# Patient Record
Sex: Male | Born: 1970 | Race: Black or African American | Hispanic: No | Marital: Married | Smoking: Never smoker
Health system: Southern US, Community
[De-identification: ages and names within clinical notes are randomized; demographics above are authoritative.]

## PROBLEM LIST (undated history)

## (undated) DIAGNOSIS — I10 Essential (primary) hypertension: Secondary | ICD-10-CM

## (undated) DIAGNOSIS — N200 Calculus of kidney: Secondary | ICD-10-CM

## (undated) DIAGNOSIS — E7800 Pure hypercholesterolemia, unspecified: Secondary | ICD-10-CM

---

## 2005-03-27 ENCOUNTER — Ambulatory Visit: Payer: Self-pay

## 2005-03-29 ENCOUNTER — Encounter: Admission: RE | Admit: 2005-03-29 | Discharge: 2005-03-29 | Payer: Self-pay | Admitting: Internal Medicine

## 2012-07-12 ENCOUNTER — Encounter (HOSPITAL_COMMUNITY): Payer: Self-pay | Admitting: Emergency Medicine

## 2012-07-12 ENCOUNTER — Emergency Department (HOSPITAL_COMMUNITY)
Admission: EM | Admit: 2012-07-12 | Discharge: 2012-07-13 | Disposition: A | Discharge status: Home / Self Care | Payer: BC Managed Care – PPO | Attending: Emergency Medicine | Admitting: Emergency Medicine

## 2012-07-12 DIAGNOSIS — IMO0002 Reserved for concepts with insufficient information to code with codable children: Secondary | ICD-10-CM | POA: Insufficient documentation

## 2012-07-12 DIAGNOSIS — I10 Essential (primary) hypertension: Secondary | ICD-10-CM | POA: Insufficient documentation

## 2012-07-12 DIAGNOSIS — R229 Localized swelling, mass and lump, unspecified: Secondary | ICD-10-CM | POA: Insufficient documentation

## 2012-07-12 DIAGNOSIS — T7840XA Allergy, unspecified, initial encounter: Secondary | ICD-10-CM

## 2012-07-12 DIAGNOSIS — Z79899 Other long term (current) drug therapy: Secondary | ICD-10-CM | POA: Insufficient documentation

## 2012-07-12 DIAGNOSIS — E7800 Pure hypercholesterolemia, unspecified: Secondary | ICD-10-CM | POA: Insufficient documentation

## 2012-07-12 HISTORY — DX: Essential (primary) hypertension: I10

## 2012-07-12 HISTORY — DX: Pure hypercholesterolemia, unspecified: E78.00

## 2012-07-12 MED ORDER — PREDNISONE 20 MG PO TABS: ORAL_TABLET | ORAL | Status: AC

## 2012-07-12 MED ORDER — SODIUM CHLORIDE 0.9 % IV BOLUS (SEPSIS)
1000.0000 mL | Freq: Once | INTRAVENOUS | Status: AC
  Administered 2012-07-12: 1000 mL via INTRAVENOUS

## 2012-07-12 MED ORDER — FAMOTIDINE IN NACL 20-0.9 MG/50ML-% IV SOLN
20.0000 mg | Freq: Once | INTRAVENOUS | Status: AC
  Administered 2012-07-12: 20 mg via INTRAVENOUS
  Filled 2012-07-12: qty 50

## 2012-07-12 MED ORDER — DIPHENHYDRAMINE HCL 50 MG/ML IJ SOLN
25.0000 mg | Freq: Once | INTRAMUSCULAR | Status: AC
  Administered 2012-07-12: 25 mg via INTRAVENOUS
  Filled 2012-07-12: qty 1

## 2012-07-12 MED ORDER — METHYLPREDNISOLONE SODIUM SUCC 125 MG IJ SOLR
125.0000 mg | Freq: Once | INTRAMUSCULAR | Status: AC
  Administered 2012-07-12: 125 mg via INTRAVENOUS
  Filled 2012-07-12: qty 2

## 2012-07-12 NOTE — ED Notes (Signed)
Pt alert, arrives from home, c/o swelling to upper lip, onset was today, resp even unlabored, skin pwd, no stridor noted, tolerating oral secretions well

## 2012-07-16 NOTE — ED Provider Notes (Signed)
History     CSN: [id number]  Arrival date & time 07/12/12  2126   First MD Initiated Contact with Patient 07/12/12 2133      Chief Complaint  Patient presents with  . Allergic Reaction    (Consider location/radiation/quality/duration/timing/severity/associated sxs/prior treatment) HPI.... swelling of upper lip for 2 hours prior to visit.  Patient takes Lotrel which is a combination of amlodipine and benazepril.  No airway obstruction.  Able to swallow.  Nothing makes symptoms better or worse. Severity is mild to moderate.  Past Medical History  Diagnosis Date  . Hypertension   . High cholesterol     History reviewed. No pertinent past surgical history.  No family history on file.  History  Substance Use Topics  . Smoking status: Never Smoker   . Smokeless tobacco: Not on file  . Alcohol Use: No      Review of Systems  All other systems reviewed and are negative.    Allergies  Ibuprofen  Home Medications   Current Outpatient Rx  Name  Route  Sig  Dispense  Refill  . amLODipine-benazepril (LOTREL) 10-40 MG per capsule   Oral   Take 1 capsule by mouth daily.         Marland Kitchen atorvastatin (LIPITOR) 80 MG tablet   Oral   Take 80 mg by mouth at bedtime.         . cetirizine (ZYRTEC) 10 MG tablet   Oral   Take 10 mg by mouth at bedtime.         . fluticasone (FLONASE) 50 MCG/ACT nasal spray   Nasal   Place 2 sprays into the nose daily.         Marland Kitchen OVER THE COUNTER MEDICATION   Oral   Take 1 capsule by mouth once. Benadryl allergy liquid gel         . predniSONE (DELTASONE) 20 MG tablet      3 tabs po day one, then 2 tabs daily x 4 days   11 tablet   0     BP 131/90  Pulse 78  Resp 18  Wt 165 lb (74.844 kg)  SpO2 100%  Physical Exam  Nursing note and vitals reviewed. Constitutional: He is oriented to person, place, and time. He appears well-developed and well-nourished.  HENT:  Head: Normocephalic and atraumatic.  Upper lip puffy and  edematous  Eyes: Conjunctivae and EOM are normal. Pupils are equal, round, and reactive to light.  Neck: Normal range of motion. Neck supple.  Cardiovascular: Normal rate, regular rhythm and normal heart sounds.   Pulmonary/Chest: Effort normal and breath sounds normal.  Abdominal: Soft. Bowel sounds are normal.  Musculoskeletal: Normal range of motion.  Neurological: He is alert and oriented to person, place, and time.  Skin: Skin is warm and dry.  Psychiatric: He has a normal mood and affect.    ED Course  Procedures (including critical care time)  Labs Reviewed - No data to display No results found.   1. Allergic reaction       MDM  IV Solu-Medrol, Benadryl, Pepcid given.   Discussed possibility of benazepril and induced edema.  Amlodipine could also be the offender.  Will discuss new antihypertensive treatment with his primary care Dr.    Patient feeling better at discharge        Donnetta Hutching, MD 07/16/12 [phone]

## 2014-05-13 ENCOUNTER — Emergency Department (HOSPITAL_COMMUNITY): Payer: BC Managed Care – PPO

## 2014-05-13 ENCOUNTER — Encounter (HOSPITAL_COMMUNITY): Payer: Self-pay | Admitting: *Deleted

## 2014-05-13 ENCOUNTER — Emergency Department (HOSPITAL_COMMUNITY)
Admission: EM | Admit: 2014-05-13 | Discharge: 2014-05-13 | Disposition: A | Discharge status: Home / Self Care | Payer: BC Managed Care – PPO | Attending: Emergency Medicine | Admitting: Emergency Medicine

## 2014-05-13 DIAGNOSIS — I10 Essential (primary) hypertension: Secondary | ICD-10-CM | POA: Diagnosis not present

## 2014-05-13 DIAGNOSIS — N200 Calculus of kidney: Secondary | ICD-10-CM | POA: Insufficient documentation

## 2014-05-13 DIAGNOSIS — R1033 Periumbilical pain: Secondary | ICD-10-CM | POA: Diagnosis present

## 2014-05-13 DIAGNOSIS — Z7951 Long term (current) use of inhaled steroids: Secondary | ICD-10-CM | POA: Diagnosis not present

## 2014-05-13 DIAGNOSIS — Z79899 Other long term (current) drug therapy: Secondary | ICD-10-CM | POA: Insufficient documentation

## 2014-05-13 DIAGNOSIS — R109 Unspecified abdominal pain: Secondary | ICD-10-CM

## 2014-05-13 DIAGNOSIS — E780 Pure hypercholesterolemia: Secondary | ICD-10-CM | POA: Insufficient documentation

## 2014-05-13 LAB — CBC WITH DIFFERENTIAL/PLATELET
Basophils Absolute: 0 10*3/uL (ref 0.0–0.1)
Basophils Relative: 0 % (ref 0–1)
Eosinophils Absolute: 0 10*3/uL (ref 0.0–0.7)
Eosinophils Relative: 0 % (ref 0–5)
HCT: 43.2 % (ref 39.0–52.0)
Hemoglobin: 15 g/dL (ref 13.0–17.0)
Lymphocytes Relative: 5 % — ABNORMAL LOW (ref 12–46)
Lymphs Abs: 0.7 10*3/uL (ref 0.7–4.0)
MCH: 31.3 pg (ref 26.0–34.0)
MCHC: 34.7 g/dL (ref 30.0–36.0)
MCV: 90 fL (ref 78.0–100.0)
Monocytes Absolute: 1 10*3/uL (ref 0.1–1.0)
Monocytes Relative: 8 % (ref 3–12)
Neutro Abs: 11.5 10*3/uL — ABNORMAL HIGH (ref 1.7–7.7)
Neutrophils Relative %: 87 % — ABNORMAL HIGH (ref 43–77)
Platelets: 200 10*3/uL (ref 150–400)
RBC: 4.8 MIL/uL (ref 4.22–5.81)
RDW: 12.5 % (ref 11.5–15.5)
WBC: 13.2 10*3/uL — ABNORMAL HIGH (ref 4.0–10.5)

## 2014-05-13 LAB — URINALYSIS, ROUTINE W REFLEX MICROSCOPIC
Bilirubin Urine: NEGATIVE
Glucose, UA: NEGATIVE mg/dL
Ketones, ur: 40 mg/dL — AB
Leukocytes, UA: NEGATIVE
Nitrite: NEGATIVE
Protein, ur: NEGATIVE mg/dL
Specific Gravity, Urine: 1.019 (ref 1.005–1.030)
Urobilinogen, UA: 1 mg/dL (ref 0.0–1.0)
pH: 8.5 — ABNORMAL HIGH (ref 5.0–8.0)

## 2014-05-13 LAB — COMPREHENSIVE METABOLIC PANEL
ALT: 15 U/L (ref 0–53)
AST: 25 U/L (ref 0–37)
Albumin: 4.1 g/dL (ref 3.5–5.2)
Alkaline Phosphatase: 95 U/L (ref 39–117)
Anion gap: 17 — ABNORMAL HIGH (ref 5–15)
BUN: 16 mg/dL (ref 6–23)
CO2: 21 mEq/L (ref 19–32)
Calcium: 9.3 mg/dL (ref 8.4–10.5)
Chloride: 99 mEq/L (ref 96–112)
Creatinine, Ser: 1.33 mg/dL (ref 0.50–1.35)
GFR calc Af Amer: 73 mL/min — ABNORMAL LOW (ref >90)
GFR calc non Af Amer: 63 mL/min — ABNORMAL LOW (ref >90)
Glucose, Bld: 146 mg/dL — ABNORMAL HIGH (ref 70–99)
Potassium: 3.9 mEq/L (ref 3.7–5.3)
Sodium: 137 mEq/L (ref 137–147)
Total Bilirubin: 0.4 mg/dL (ref 0.3–1.2)
Total Protein: 8 g/dL (ref 6.0–8.3)

## 2014-05-13 LAB — LIPASE, BLOOD: Lipase: 21 U/L (ref 11–59)

## 2014-05-13 LAB — URINE MICROSCOPIC-ADD ON

## 2014-05-13 MED ORDER — MORPHINE SULFATE 4 MG/ML IJ SOLN
6.0000 mg | Freq: Once | INTRAMUSCULAR | Status: AC
  Administered 2014-05-13: 6 mg via INTRAVENOUS
  Filled 2014-05-13: qty 2

## 2014-05-13 MED ORDER — SODIUM CHLORIDE 0.9 % IV BOLUS (SEPSIS)
1000.0000 mL | Freq: Once | INTRAVENOUS | Status: AC
  Administered 2014-05-13: 1000 mL via INTRAVENOUS

## 2014-05-13 MED ORDER — TAMSULOSIN HCL 0.4 MG PO CAPS: 0.4000 mg | ORAL_CAPSULE | Freq: Every day | ORAL | Status: DC

## 2014-05-13 MED ORDER — ONDANSETRON HCL 4 MG/2ML IJ SOLN
4.0000 mg | Freq: Once | INTRAMUSCULAR | Status: AC
  Administered 2014-05-13: 4 mg via INTRAVENOUS
  Filled 2014-05-13: qty 2

## 2014-05-13 MED ORDER — IOHEXOL 300 MG/ML  SOLN
100.0000 mL | Freq: Once | INTRAMUSCULAR | Status: AC | PRN
  Administered 2014-05-13: 100 mL via INTRAVENOUS

## 2014-05-13 MED ORDER — OXYCODONE-ACETAMINOPHEN 5-325 MG PO TABS: 1.0000 | ORAL_TABLET | Freq: Four times a day (QID) | ORAL | Status: DC | PRN

## 2014-05-13 MED ORDER — IOHEXOL 300 MG/ML  SOLN
50.0000 mL | Freq: Once | INTRAMUSCULAR | Status: AC | PRN
  Administered 2014-05-13: 50 mL via ORAL

## 2014-05-13 MED ORDER — OXYCODONE-ACETAMINOPHEN 5-325 MG PO TABS
1.0000 | ORAL_TABLET | Freq: Once | ORAL | Status: AC
  Administered 2014-05-13: 1 via ORAL
  Filled 2014-05-13: qty 1

## 2014-05-13 NOTE — Discharge Instructions (Signed)
Please use urine strainer to capture your 52mm kidney stone and bring it to the urologist for further evaluation.    Kidney Stones Kidney stones (urolithiasis) are solid masses that form inside your kidneys. The intense pain is caused by the stone moving through the kidney, ureter, bladder, and urethra (urinary tract). When the stone moves, the ureter starts to spasm around the stone. The stone is usually passed in your pee (urine).  HOME CARE  Drink enough fluids to keep your pee clear or pale yellow. This helps to get the stone out.  Strain all pee through the provided strainer. Do not pee without peeing through the strainer, not even once. If you pee the stone out, catch it in the strainer. The stone may be as small as a grain of salt. Take this to your doctor. This will help your doctor figure out what you can do to try to prevent more kidney stones.  Only take medicine as told by your doctor.  Follow up with your doctor as told.  Get follow-up X-rays as told by your doctor. GET HELP IF: You have pain that gets worse even if you have been taking pain medicine. GET HELP RIGHT AWAY IF:   Your pain does not get better with medicine.  You have a fever or shaking chills.  Your pain increases and gets worse over 18 hours.  You have new belly (abdominal) pain.  You feel faint or pass out.  You are unable to pee. MAKE SURE YOU:   Understand these instructions.  Will watch your condition.  Will get help right away if you are not doing well or get worse. Document Released: 10/31/2007 Document Revised: 01/14/2013 Document Reviewed: 10/15/2012 [facility] Patient Information 2015 [place], [place]. This information is not intended to replace advice given to you by your health care provider. Make sure you discuss any questions you have with your health care provider.

## 2014-05-13 NOTE — ED Notes (Signed)
Pt reports after eating breakfast this morning he started having abdominal cramping. Pt denies n/v/d. Pt took milk of mag and ducolax prior to arrival without relief.

## 2014-05-13 NOTE — ED Notes (Signed)
Patient is being transported to CT.

## 2014-05-13 NOTE — ED Provider Notes (Signed)
CSN: [id number]     Arrival date & time 05/13/14  1110 History   First MD Initiated Contact with Patient 05/13/14 1220     Chief Complaint  Patient presents with  . Abdominal Pain     (Consider location/radiation/quality/duration/timing/severity/associated sxs/prior Treatment) HPI   43 year old male who presents for evaluation of abdominal pain. Patient developed gradual onset of periumbilical abdominal cramping which started this morning at the feet. Describe pain as 10 out of 10, persistent cramping, worsening with movement. He tries having bowel movement with minimal relief. He also attempted take milk of magnesia and Dulcolax without relief. Pain is intense, causing him to double over. He has never had this kind of pain before. He felt nauseous without vomiting or diarrhea. No associated fever or chills, no chest pain, shortness of breath, productive cough, back pain, dysuria, testicular pain or scrotal swelling, penile discharge or rash. Patient is a nonsmoker and only drink alcohol on occasion. Denies any recent trauma. No prior history of abdominal surgery. Patient states since Tuesday he has experienced some difficulty having bowel movement and abdominal cramping but denies constipation. No complaints of hematemesis, hematochezia or melena.  Past Medical History  Diagnosis Date  . Hypertension   . High cholesterol    History reviewed. No pertinent past surgical history. No family history on file. History  Substance Use Topics  . Smoking status: Never Smoker   . Smokeless tobacco: Not on file  . Alcohol Use: Yes    Review of Systems  All other systems reviewed and are negative.     Allergies  Ibuprofen  Home Medications   Prior to Admission medications   Medication Sig Start Date End Date Taking? Authorizing Provider  amLODipine-benazepril (LOTREL) 10-40 MG per capsule Take 1 capsule by mouth daily.    Historical Provider, MD  atorvastatin (LIPITOR) 80 MG tablet Take  80 mg by mouth at bedtime.    Historical Provider, MD  cetirizine (ZYRTEC) 10 MG tablet Take 10 mg by mouth at bedtime.    Historical Provider, MD  fluticasone (FLONASE) 50 MCG/ACT nasal spray Place 2 sprays into the nose daily.    Historical Provider, MD  OVER THE COUNTER MEDICATION Take 1 capsule by mouth once. Benadryl allergy liquid gel    Historical Provider, MD  predniSONE (DELTASONE) 20 MG tablet 3 tabs po day one, then 2 tabs daily x 4 days 07/12/12   Nat Christen, MD   BP 136/81 mmHg  Pulse 60  Temp(Src) 98.1 F (36.7 C) (Oral)  Resp 18  Ht 5\' 6"  (1.676 m)  Wt 160 lb (72.576 kg)  BMI 25.84 kg/m2  SpO2 100% Physical Exam  Constitutional: He appears well-developed and well-nourished. No distress.  HENT:  Head: Atraumatic.  Eyes: Conjunctivae are normal.  Neck: Normal range of motion. Neck supple.  Cardiovascular: Normal rate and regular rhythm.   Pulmonary/Chest: Effort normal and breath sounds normal.  Abdominal: Soft. Bowel sounds are normal. He exhibits no distension. There is tenderness (periumbilical abdominal tenderness without guarding or rebound tenderness, negative Murphy's sign, no pain at McBurney's point).  Genitourinary:  Normal circumcise penis, testicles nontender with normal lie, no scrotal swelling, No inguinal hernia noted, no rash  L CVA tenderness  Neurological: He is alert.  Skin: No rash noted.  Psychiatric: He has a normal mood and affect.    ED Course  Procedures (including critical care time)  12:34 PM Patient here with periumbilical abdominal tenderness suggestive of early appendicitis. Will obtain CT scan for  further evaluation. Pain medication and antinausea medication.  Patient also has left CVA tenderness and did complain of some mild urinary discomfort. he will also need to be assessed for potential kidney stone, no prior history of kidney stones in the past.  3:22 PM UA without obvious signs of urinary tract infection however there is  hemoglobin in the urine. Abdominal and pelvis CT scan demonstrated a 5 x 2 mm calculus to the left UVJ with hydronephrosis and perinephric fluid. Normal appendix, no other significant finding aside from mild cystitis. Urine culture sent, patient is able to tolerate by mouth and will be discharge with pain medication, Flomax, and refer to urology for further care. Return precautions discussed.  Care discussed with Dr. Wyvonnia Dusky.    Labs Review Labs Reviewed  COMPREHENSIVE METABOLIC PANEL - Abnormal; Notable for the following:    Glucose, Bld 146 (*)    GFR calc non Af Amer 63 (*)    GFR calc Af Amer 73 (*)    Anion gap 17 (*)    All other components within normal limits  URINALYSIS, ROUTINE W REFLEX MICROSCOPIC - Abnormal; Notable for the following:    pH 8.5 (*)    Hgb urine dipstick MODERATE (*)    Ketones, ur 40 (*)    All other components within normal limits  CBC WITH DIFFERENTIAL - Abnormal; Notable for the following:    WBC 13.2 (*)    Neutrophils Relative % 87 (*)    Neutro Abs 11.5 (*)    Lymphocytes Relative 5 (*)    All other components within normal limits  URINE MICROSCOPIC-ADD ON - Abnormal; Notable for the following:    Bacteria, UA FEW (*)    All other components within normal limits  URINE CULTURE  LIPASE, BLOOD  CBC WITH DIFFERENTIAL    Imaging Review Ct Abdomen Pelvis W Contrast  05/13/2014   CLINICAL DATA:  One day history of cramping abdominal pain  EXAM: CT ABDOMEN AND PELVIS WITH CONTRAST  TECHNIQUE: Multidetector CT imaging of the abdomen and pelvis was performed using the standard protocol following bolus administration of intravenous contrast. Oral contrast was also administered.  CONTRAST:  37mL OMNIPAQUE IOHEXOL 300 MG/ML SOLN, 162mL OMNIPAQUE IOHEXOL 300 MG/ML SOLN  COMPARISON:  None.  FINDINGS: Lung bases are clear.  Liver is prominent, measuring 17.0 cm in length. There is hepatic steatosis. No focal liver lesions are identified. Gallbladder wall is not  thickened. There is no appreciable biliary duct dilatation.  Spleen, pancreas, and adrenals appear normal. There is a small splenule between the left kidney and inferior spleen.  Right kidney appears normal without mass or hydronephrosis. There is a 1 mm nonobstructing calculus in the upper pole right kidney. There is no ureteral calculus on the right. On the left, there is moderate perinephric fluid and mild hydronephrosis. There is no left renal mass. There is a 2 mm calculus in the anterior upper pole left kidney. There is ureterectasis on the left to the level of the ureterovesical junction. There is a 5 x 2 mm calculus at the left ureterovesical junction.  In the pelvis, there is slight wall thickening in the urinary bladder. There is a small amount of free fluid in the dependent portion of the pelvis. There is mild prominence of the prostate. There is no pelvic mass. Appendix appears normal.  There is no bowel obstruction.  No free air or portal venous air.  There is no adenopathy or abscess in the abdomen or pelvis.  There is atherosclerotic change in both common iliac arteries. There is no demonstrable abdominal aortic aneurysm. There are no blastic or lytic bone lesions.  IMPRESSION: 5 x 2 mm calculus left ureterovesical junction with hydronephrosis, ureterectasis, and perinephric fluid on the left.  Small nonobstructing calculi in each kidney.  Prominent liver with hepatic steatosis.  No bowel obstruction.  No abscess.  Appendix appears normal.  Suspect mild cystitis. Prostate mildly prominent for age. Small amount of ascites in the pelvis, probably reactive from the distal ureteral calculus on the left.   Electronically Signed   By: Lowella Grip M.D.   On: 05/13/2014 14:43     EKG Interpretation None      MDM   Final diagnoses:  Abdominal pain, acute  Kidney stone on left side    BP 139/86 mmHg  Pulse 57  Temp(Src) 98.1 F (36.7 C) (Oral)  Resp 16  Ht 5\' 6"  (1.676 m)  Wt 160 lb  (72.576 kg)  BMI 25.84 kg/m2  SpO2 100%  I have reviewed nursing notes and vital signs. I personally reviewed the imaging tests through PACS system  I reviewed available ER/hospitalization records thought the EMR     Domenic Moras, PA-C 05/13/14 [place], MD 05/13/14 1725

## 2014-05-14 LAB — URINE CULTURE
Colony Count: NO GROWTH
Culture: NO GROWTH
Special Requests: NORMAL

## 2014-12-06 ENCOUNTER — Encounter (HOSPITAL_COMMUNITY): Payer: Self-pay | Admitting: *Deleted

## 2014-12-06 ENCOUNTER — Emergency Department (HOSPITAL_COMMUNITY)
Admission: EM | Admit: 2014-12-06 | Discharge: 2014-12-07 | Disposition: A | Discharge status: Home / Self Care | Payer: BLUE CROSS/BLUE SHIELD | Attending: Emergency Medicine | Admitting: Emergency Medicine

## 2014-12-06 ENCOUNTER — Emergency Department (HOSPITAL_COMMUNITY): Payer: BLUE CROSS/BLUE SHIELD

## 2014-12-06 DIAGNOSIS — R109 Unspecified abdominal pain: Secondary | ICD-10-CM

## 2014-12-06 DIAGNOSIS — Z7951 Long term (current) use of inhaled steroids: Secondary | ICD-10-CM | POA: Diagnosis not present

## 2014-12-06 DIAGNOSIS — Z79899 Other long term (current) drug therapy: Secondary | ICD-10-CM | POA: Diagnosis not present

## 2014-12-06 DIAGNOSIS — N200 Calculus of kidney: Secondary | ICD-10-CM | POA: Diagnosis present

## 2014-12-06 DIAGNOSIS — Z7952 Long term (current) use of systemic steroids: Secondary | ICD-10-CM | POA: Insufficient documentation

## 2014-12-06 DIAGNOSIS — Z8639 Personal history of other endocrine, nutritional and metabolic disease: Secondary | ICD-10-CM | POA: Diagnosis not present

## 2014-12-06 DIAGNOSIS — I10 Essential (primary) hypertension: Secondary | ICD-10-CM | POA: Insufficient documentation

## 2014-12-06 HISTORY — DX: Calculus of kidney: N20.0

## 2014-12-06 LAB — URINALYSIS, ROUTINE W REFLEX MICROSCOPIC
Bilirubin Urine: NEGATIVE
Glucose, UA: NEGATIVE mg/dL
Hgb urine dipstick: NEGATIVE
Ketones, ur: NEGATIVE mg/dL
Leukocytes, UA: NEGATIVE
Nitrite: NEGATIVE
Protein, ur: NEGATIVE mg/dL
Specific Gravity, Urine: 1.016 (ref 1.005–1.030)
Urobilinogen, UA: 0.2 mg/dL (ref 0.0–1.0)
pH: 7.5 (ref 5.0–8.0)

## 2014-12-06 MED ORDER — HYDROMORPHONE HCL 1 MG/ML IJ SOLN
1.0000 mg | Freq: Once | INTRAMUSCULAR | Status: AC
  Administered 2014-12-06: 1 mg via INTRAVENOUS
  Filled 2014-12-06: qty 1

## 2014-12-06 NOTE — ED Notes (Signed)
Per EMS pt from home, started having R sided flank pain about 45 min ago, hx of kidney stones, difficulty w/ urination.

## 2014-12-06 NOTE — ED Notes (Signed)
Bed: [facility] Expected date:  Expected time:  Means of arrival:  Comments: EMS 44yo Kidney stone

## 2014-12-06 NOTE — ED Notes (Signed)
100 mcg fentanyl given by EMS, 20G L wrist

## 2014-12-07 ENCOUNTER — Emergency Department (HOSPITAL_COMMUNITY): Payer: BLUE CROSS/BLUE SHIELD

## 2014-12-07 LAB — I-STAT CHEM 8, ED
BUN: 19 mg/dL (ref 6–20)
Calcium, Ion: 1.12 mmol/L (ref 1.12–1.23)
Chloride: 101 mmol/L (ref 101–111)
Creatinine, Ser: 1.2 mg/dL (ref 0.61–1.24)
Glucose, Bld: 141 mg/dL — ABNORMAL HIGH (ref 65–99)
HCT: 45 % (ref 39.0–52.0)
Hemoglobin: 15.3 g/dL (ref 13.0–17.0)
Potassium: 3.2 mmol/L — ABNORMAL LOW (ref 3.5–5.1)
Sodium: 140 mmol/L (ref 135–145)
TCO2: 28 mmol/L (ref 0–100)

## 2014-12-07 MED ORDER — TAMSULOSIN HCL 0.4 MG PO CAPS
0.4000 mg | ORAL_CAPSULE | Freq: Once | ORAL | Status: AC
  Administered 2014-12-07: 0.4 mg via ORAL
  Filled 2014-12-07: qty 1

## 2014-12-07 MED ORDER — OXYCODONE-ACETAMINOPHEN 5-325 MG PO TABS: 1.0000 | ORAL_TABLET | ORAL | Status: AC | PRN

## 2014-12-07 MED ORDER — TAMSULOSIN HCL 0.4 MG PO CAPS: 0.4000 mg | ORAL_CAPSULE | Freq: Every day | ORAL | Status: AC

## 2014-12-07 MED ORDER — ONDANSETRON 4 MG PO TBDP
4.0000 mg | ORAL_TABLET | Freq: Once | ORAL | Status: AC
  Administered 2014-12-07: 4 mg via ORAL
  Filled 2014-12-07: qty 1

## 2014-12-07 MED ORDER — OXYCODONE-ACETAMINOPHEN 5-325 MG PO TABS
1.0000 | ORAL_TABLET | Freq: Once | ORAL | Status: AC
  Administered 2014-12-07: 1 via ORAL
  Filled 2014-12-07: qty 1

## 2014-12-07 MED ORDER — HYDROMORPHONE HCL 1 MG/ML IJ SOLN
1.0000 mg | Freq: Once | INTRAMUSCULAR | Status: AC
  Administered 2014-12-07: 1 mg via INTRAVENOUS
  Filled 2014-12-07: qty 1

## 2014-12-07 NOTE — ED Notes (Signed)
Urinary Strainer given to take home.

## 2014-12-07 NOTE — ED Provider Notes (Signed)
CSN: [id number]     Arrival date & time 12/06/14  2220 History   First MD Initiated Contact with Patient 12/06/14 2235     Chief Complaint  Patient presents with  . Nephrolithiasis    HPI   44 YOM presents EMS with right-sided flank pain that started approximately 1 hour prior to arrival at the emergency department. Patient reports history of kidney stones; was seen here in the ED on 05/08/2014 found to have multiple stones. Patient reports some nausea, denies fever, vomiting, significant abdominal pain, blood or difficulty with urination. No trauma to the abdomen or flank. Patient is not tried any over-the-counter therapies. Patient reports pain does not radiate. Not made worse by any activity.   Past Medical History  Diagnosis Date  . Hypertension   . High cholesterol   . Kidney stone    History reviewed. No pertinent past surgical history. No family history on file. History  Substance Use Topics  . Smoking status: Never Smoker   . Smokeless tobacco: Not on file  . Alcohol Use: Yes    Review of Systems  All other systems reviewed and are negative.   Allergies  Ibuprofen  Home Medications   Prior to Admission medications   Medication Sig Start Date End Date Taking? Authorizing Provider  amLODipine-benazepril (LOTREL) 10-40 MG per capsule Take 1 capsule by mouth daily.   Yes Historical Provider, MD  cetirizine (ZYRTEC) 10 MG tablet Take 10 mg by mouth 3 times/day as needed-between meals & bedtime for allergies.    Yes Historical Provider, MD  fluticasone (FLONASE) 50 MCG/ACT nasal spray Place 2 sprays into the nose daily.   Yes Historical Provider, MD  oxyCODONE-acetaminophen (ROXICET) 5-325 MG per tablet Take 1 tablet by mouth every 4 (four) hours as needed for severe pain. 12/07/14   Okey Regal, PA-C  predniSONE (DELTASONE) 20 MG tablet 3 tabs po day one, then 2 tabs daily x 4 days Patient not taking: Reported on 05/13/2014 07/12/12   Nat Christen, MD  tamsulosin  (FLOMAX) 0.4 MG CAPS capsule Take 1 capsule (0.4 mg total) by mouth daily. 12/07/14   Mossie Gilder, PA-C   BP 140/97 mmHg  Pulse 90  Temp(Src) 97.8 F (36.6 C) (Oral)  Resp 12  SpO2 100% Physical Exam  Constitutional: He is oriented to person, place, and time. He appears well-developed and well-nourished.  HENT:  Head: Normocephalic and atraumatic.  Eyes: Pupils are equal, round, and reactive to light.  Neck: Normal range of motion. Neck supple. No JVD present. No tracheal deviation present. No thyromegaly present.  Cardiovascular: Normal rate, regular rhythm, normal heart sounds and intact distal pulses.  Exam reveals no gallop and no friction rub.   No murmur heard. Pulmonary/Chest: Effort normal and breath sounds normal. No stridor. No respiratory distress. He has no wheezes. He has no rales. He exhibits no tenderness.  Abdominal: Soft. He exhibits no distension and no mass. There is no tenderness. There is no rebound and no guarding.  Musculoskeletal: Normal range of motion.  Lymphadenopathy:    He has no cervical adenopathy.  Neurological: He is alert and oriented to person, place, and time. Coordination normal.  Skin: Skin is warm and dry.  Psychiatric: He has a normal mood and affect. His behavior is normal. Judgment and thought content normal.  Nursing note and vitals reviewed.   ED Course  Procedures (including critical care time) Labs Review Labs Reviewed  URINALYSIS, ROUTINE W REFLEX MICROSCOPIC (NOT AT [facility]) - Abnormal; Notable  for the following:    APPearance CLOUDY (*)    All other components within normal limits  I-STAT CHEM 8, ED - Abnormal; Notable for the following:    Potassium 3.2 (*)    Glucose, Bld 141 (*)    All other components within normal limits    Imaging Review Ct Renal Stone Study  12/07/2014   CLINICAL DATA:  44 year old male with right flank pain.  EXAM: CT ABDOMEN AND PELVIS WITHOUT CONTRAST  TECHNIQUE: Multidetector CT imaging of the  abdomen and pelvis was performed following the standard protocol without IV contrast.  COMPARISON:  CT dated 05/13/2014  FINDINGS: Evaluation of this exam is limited in the absence of intravenous contrast.  The visualized lung bases are clear. No intra-abdominal free air or free fluid.  The liver appears unremarkable. The dome of the liver is not included in the image and not evaluated. The gallbladder, pancreas, spleen, adrenal glands, left kidney and ureter, and urinary bladder appear unremarkable. There is a 3 mm right ureterovesical junction stone with mild right hydronephrosis. A 5 mm nonobstructing right renal interpolar calculus noted. Mild enlargement of the prostate gland measuring up to 5.5 cm transverse diameter.  No evidence of bowel obstruction or inflammation. The appendix appears unremarkable.  Mild aortoiliac atherosclerotic disease. No lymphadenopathy. Small fat containing umbilical hernia. The osseous structures appear unremarkable.  IMPRESSION: A 3 mm right UVJ stone with mild right hydronephrosis.   Electronically Signed   By: Anner Crete M.D.   On: 12/07/2014 01:06     EKG Interpretation None      MDM   Final diagnoses:  Flank pain    Labs: I-STAT Chem-8, urinalysis- significant for potassium 3.2 glucose of 141  Imaging: CT renal stone study- 3 mm right UVJ stone with mild right hydronephrosis  Consults:  Therapeutics: Percocet, Flomax, Zofran, Dilaudid  Assessment:  Plan: Patient presents with kidney stones. Mild hydronephrosis, small stone, this will likely pass. Pt is tolerating PO, afebrile, no signs of UTI. Discharged home with pain medication, antinausea medication, and Flomax. He was given specialist follow-up information if the stone did not pass, and strict return precautions. Patient verbalizes understanding and agreement to today's plan.       Okey Regal, PA-C 12/08/14 3159  Shanon Rosser, MD 12/08/14 [phone]

## 2015-12-08 IMAGING — CT CT RENAL STONE PROTOCOL
3 series · 9 of 21 positions shown, 16 images · non-contrast
Comparison: CT dated 05/13/2014

CLINICAL DATA: 44-year-old male with right flank pain.

EXAM:
CT ABDOMEN AND PELVIS WITHOUT CONTRAST
TECHNIQUE: Multidetector CT imaging of the abdomen and pelvis was performed
following the standard protocol without IV contrast.

[Series 3: lung · axial · 0.63mm/px · z∈[+1474,+1494]mm · 5 of 7 slices shown, 10 images]
[im 2/7  soft-tissue]
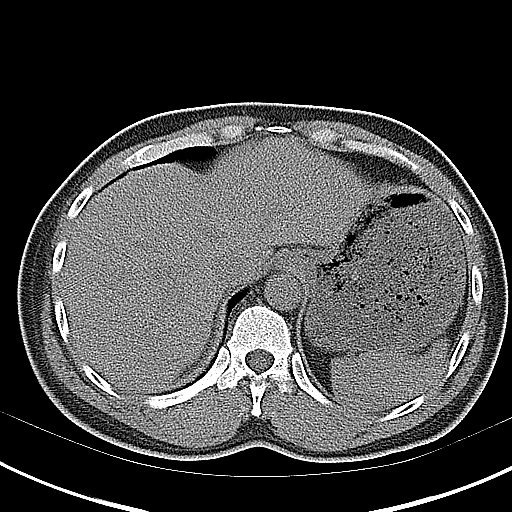
[im 2/7  bone]
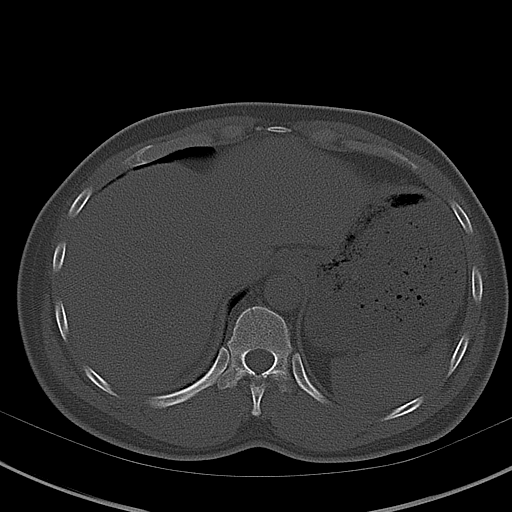
[im 3/7  soft-tissue]
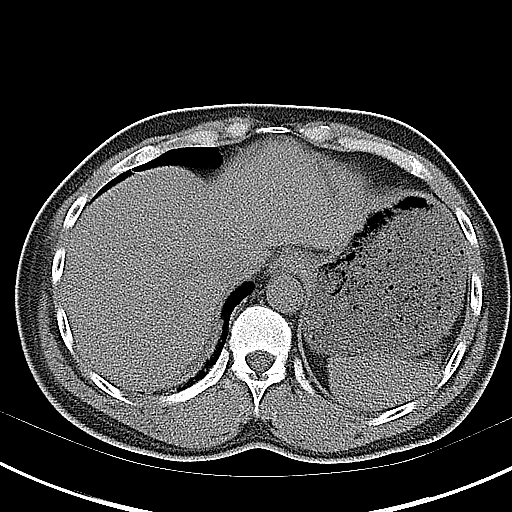
[im 3/7  lung]
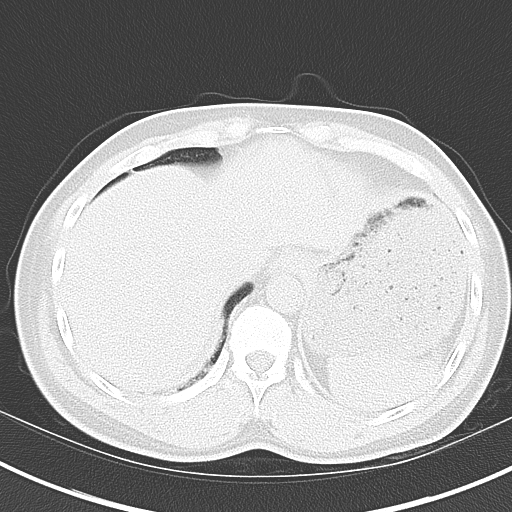
[im 4/7  soft-tissue]
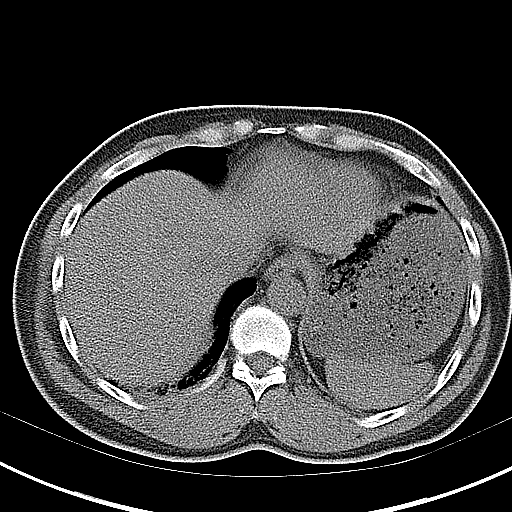
[im 4/7  lung]
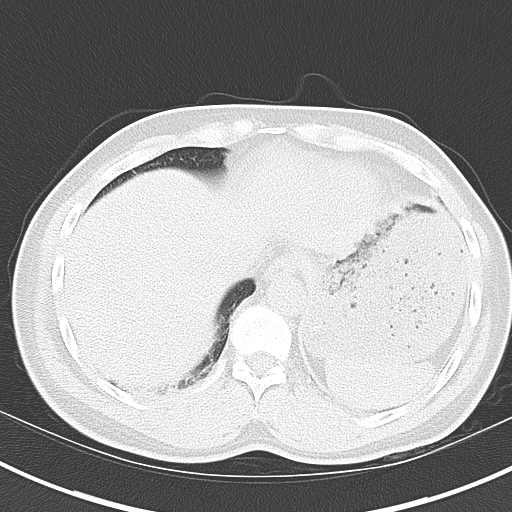
[im 5/7  soft-tissue]
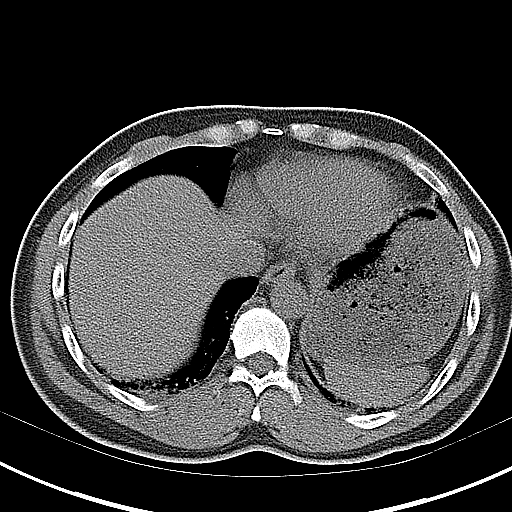
[im 5/7  lung]
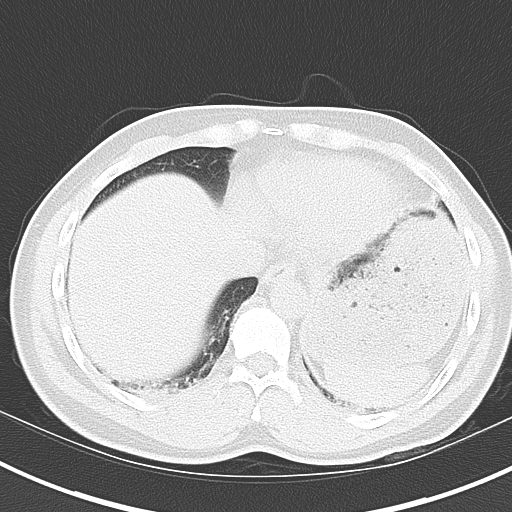
[im 6/7  soft-tissue]
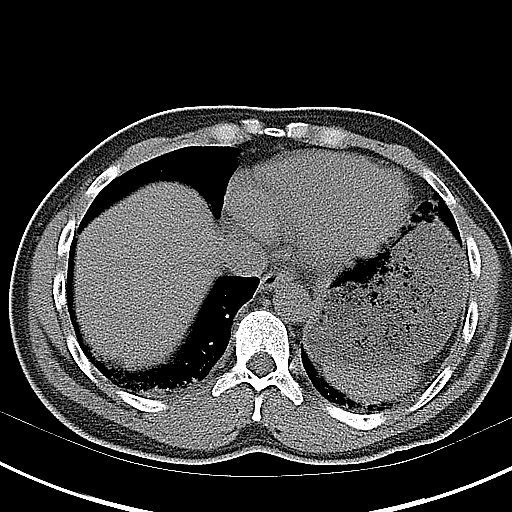
[im 6/7  lung]
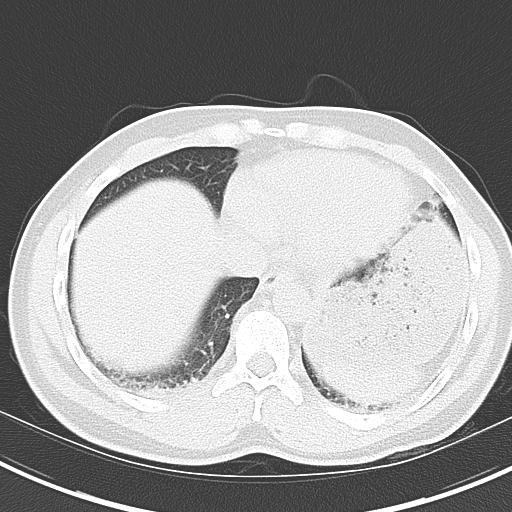

[Series 4: coronal · coronal · 0.62mm/px · 3 of 76 slices shown, 4 images]
[im 26/76  soft-tissue]
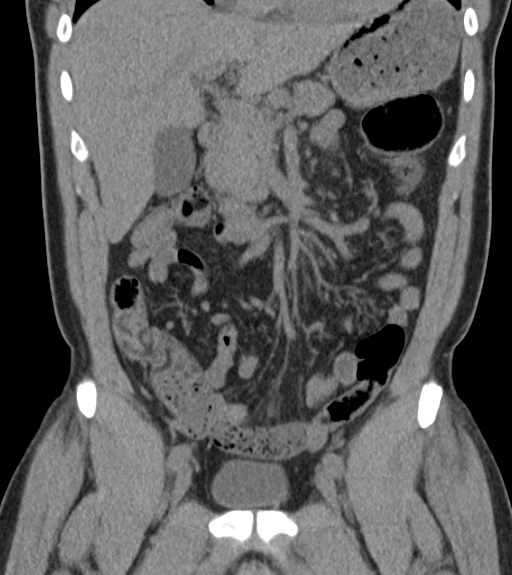
[im 34/76  soft-tissue]
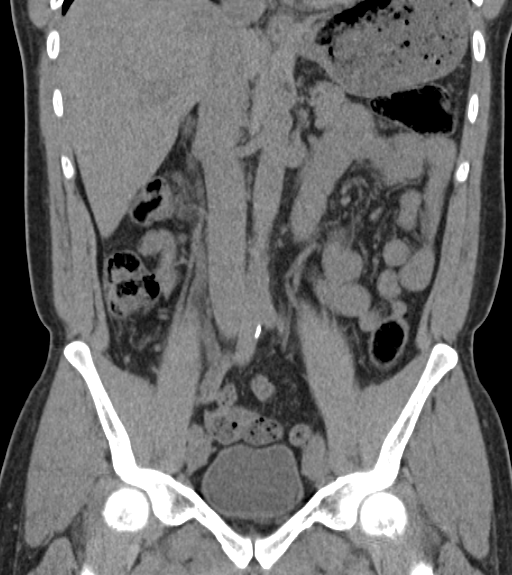
[im 34/76  bone]
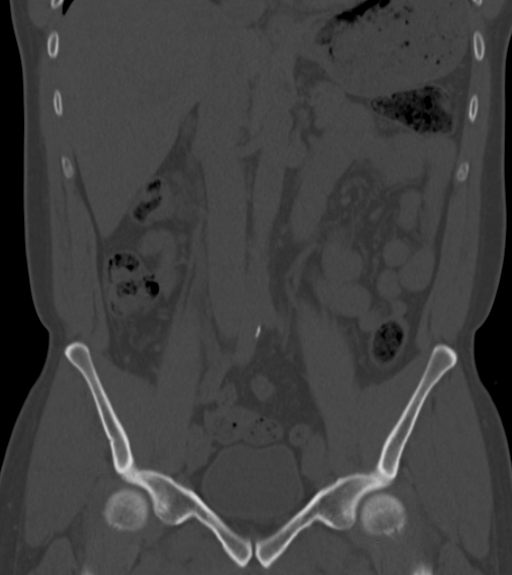
[im 42/76  soft-tissue]
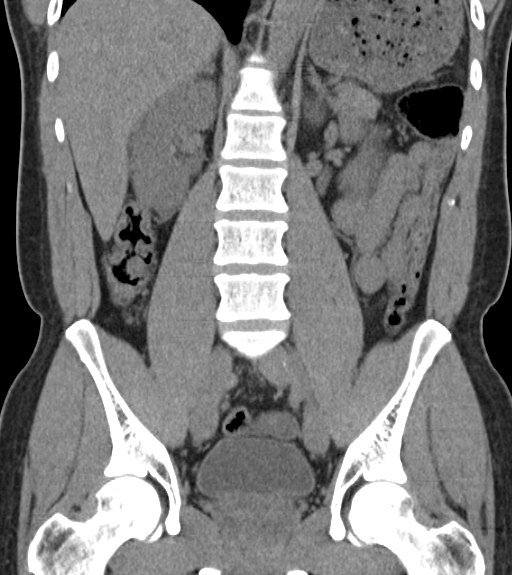

[Series 5: sagittal · sagittal · 0.47mm/px · 1 of 98 slices shown, 2 images]
[im 33/98  soft-tissue]
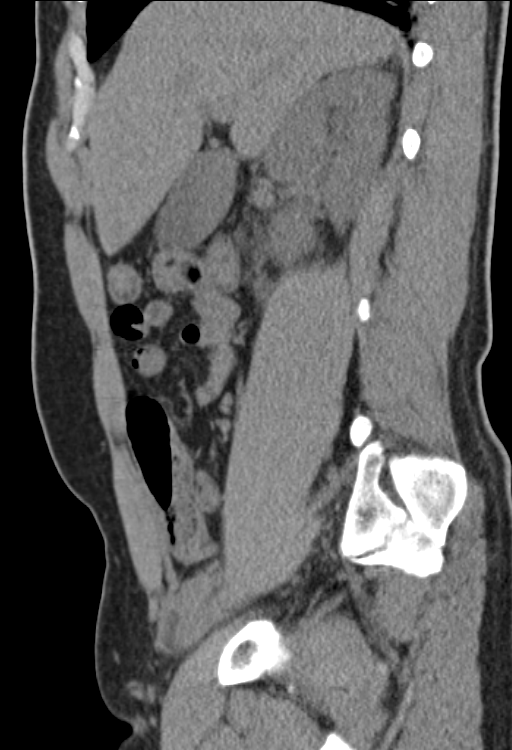
[im 33/98  bone]
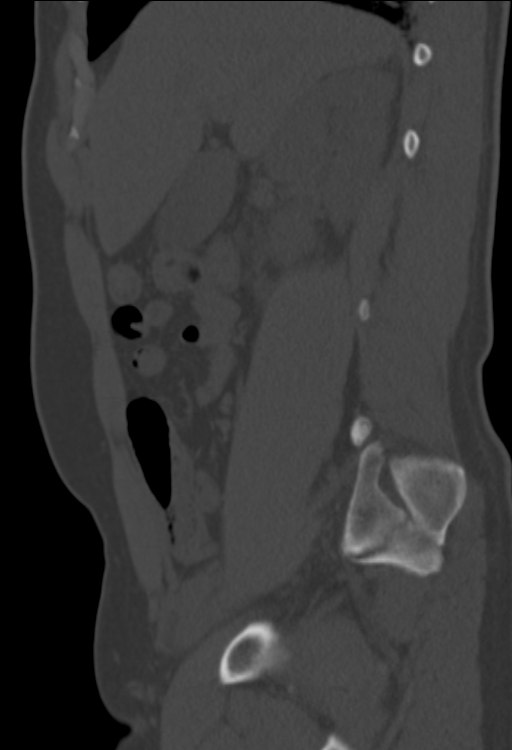

[9 of 21 positions shown; findings below may reference images not displayed]

FINDINGS: Evaluation of this exam is limited in the absence of intravenous
contrast.

The visualized lung bases are clear. No intra-abdominal free air or
free fluid.

The liver appears unremarkable. The dome of the liver is not
included in the image and not evaluated. The gallbladder, pancreas,
spleen, adrenal glands, left kidney and ureter, and urinary bladder
appear unremarkable. There is a 3 mm right ureterovesical junction
stone with mild right hydronephrosis. A 5 mm nonobstructing right
renal interpolar calculus noted. Mild enlargement of the prostate
gland measuring up to 5.5 cm transverse diameter.

No evidence of bowel obstruction or inflammation. The appendix
appears unremarkable.

Mild aortoiliac atherosclerotic disease. No lymphadenopathy. Small
fat containing umbilical hernia. The osseous structures appear
unremarkable.
IMPRESSION: A 3 mm right UVJ stone with mild right hydronephrosis.

## 2017-04-25 ENCOUNTER — Ambulatory Visit (INDEPENDENT_AMBULATORY_CARE_PROVIDER_SITE_OTHER): Payer: BLUE CROSS/BLUE SHIELD | Admitting: Diagnostic Neuroimaging

## 2017-04-25 ENCOUNTER — Encounter (INDEPENDENT_AMBULATORY_CARE_PROVIDER_SITE_OTHER): Payer: BLUE CROSS/BLUE SHIELD | Admitting: Diagnostic Neuroimaging

## 2017-04-25 DIAGNOSIS — R200 Anesthesia of skin: Secondary | ICD-10-CM | POA: Diagnosis not present

## 2017-04-25 DIAGNOSIS — Z0289 Encounter for other administrative examinations: Secondary | ICD-10-CM

## 2017-04-26 NOTE — Procedures (Signed)
[facility]  NCS (NERVE CONDUCTION STUDY) WITH EMG (ELECTROMYOGRAPHY) REPORT   STUDY DATE: 04/25/17 PATIENT NAME: Marcella Dunnaway DOB: 1970/06/19 MRN: [id number]  ORDERING CLINICIAN: Leitha Bleak, MD  TECHNOLOGIST: Oneita Jolly ELECTROMYOGRAPHER: Earlean Polka. Twania Bujak, MD  CLINICAL INFORMATION: 46 year old male with right greater than left hand numbness and tingling.  Symptoms present for last 1-2 months.  FINDINGS: NERVE CONDUCTION STUDY: Bilateral median motor responses have slightly prolonged distal latencies, normal amplitudes, normal conduction velocities.  Right ulnar motor response and F wave latency are normal.  Bilateral median sensory responses have slightly prolonged peak latencies.  Right median sensory response has decreased amplitude.  Right ulnar sensory response is normal.   NEEDLE ELECTROMYOGRAPHY: Needle examination of right upper extremity deltoid, biceps, triceps, flexor carpi radialis, first dorsal interosseous is normal.   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    INTERPRETING PHYSICIAN:  Penni Bombard, MD Certified in Neurology, Neurophysiology and Neuroimaging  [facility] [address], [place], [place] [address] [phone]   [facility]    Nerve / Sites Muscle Latency Ref. Amplitude Ref. Rel Amp Segments Distance Velocity Ref. Area    ms ms mV mV %  cm m/s m/s mVms  R Median - APB     Wrist APB 4.6 ?4.4 9.0 ?4.0 100 Wrist - APB 7   30.1     Upper arm APB 9.0  8.8  97.9 Upper arm - Wrist 23 52 ?49 30.3  L Median - APB     Wrist APB 4.4 ?4.4 8.3 ?4.0 100 Wrist - APB 7   32.7     Upper arm APB 8.5  8.0  96.3 Upper arm - Wrist 23 55 ?49 32.3  R Ulnar - ADM     Wrist ADM 2.8 ?3.3 9.9 ?6.0 100 Wrist - ADM 7   34.0     B.Elbow ADM 6.4  9.7  98.5 B.Elbow - Wrist 21 58 ?49 35.6     A.Elbow ADM  8.1  9.5  98.1 A.Elbow - B.Elbow 10 58 ?49 33.9         A.Elbow - Wrist               SNC    Nerve / Sites Rec. Site Peak Lat Ref.  Amp Ref. Segments Distance    ms ms V V  cm  R Median - Orthodromic (Dig II, Mid palm)     Dig II Wrist 4.1 ?3.4 5 ?10 Dig II - Wrist 13  L Median - Orthodromic (Dig II, Mid palm)     Dig II Wrist 3.7 ?3.4 13 ?10 Dig II - Wrist 13  R Ulnar - Orthodromic, (Dig V, Mid palm)     Dig V Wrist 2.4 ?3.1 17 ?5 Dig V - Wrist 86                   F  Wave    Nerve F Lat Ref.   ms ms  R Ulnar - ADM 27.9 ?32.0       EMG full       EMG Summary Table    Spontaneous MUAP Recruitment  Muscle IA Fib PSW Fasc Other Amp Dur. Poly Pattern  R. Deltoid Normal None None None _______ Normal Normal Normal Normal  R. Biceps brachii Normal None None None _______ Normal Normal Normal Normal  R. Triceps brachii Normal  None None None _______ Normal Normal Normal Normal  R. Flexor carpi radialis Normal None None None _______ Normal Normal Normal Normal  R. First dorsal interosseous Normal None None None _______ Normal Normal Normal Normal

## 2020-08-11 ENCOUNTER — Other Ambulatory Visit: Payer: Self-pay

## 2020-08-11 ENCOUNTER — Emergency Department (HOSPITAL_BASED_OUTPATIENT_CLINIC_OR_DEPARTMENT_OTHER)
Admission: EM | Admit: 2020-08-11 | Discharge: 2020-08-11 | Disposition: A | Discharge status: Home / Self Care | Payer: BC Managed Care – PPO | Attending: Emergency Medicine | Admitting: Emergency Medicine

## 2020-08-11 ENCOUNTER — Encounter (HOSPITAL_BASED_OUTPATIENT_CLINIC_OR_DEPARTMENT_OTHER): Payer: Self-pay | Admitting: Emergency Medicine

## 2020-08-11 DIAGNOSIS — I10 Essential (primary) hypertension: Secondary | ICD-10-CM | POA: Diagnosis not present

## 2020-08-11 DIAGNOSIS — D492 Neoplasm of unspecified behavior of bone, soft tissue, and skin: Secondary | ICD-10-CM

## 2020-08-11 DIAGNOSIS — Z79899 Other long term (current) drug therapy: Secondary | ICD-10-CM | POA: Insufficient documentation

## 2020-08-11 DIAGNOSIS — L989 Disorder of the skin and subcutaneous tissue, unspecified: Secondary | ICD-10-CM | POA: Insufficient documentation

## 2020-08-11 DIAGNOSIS — R21 Rash and other nonspecific skin eruption: Secondary | ICD-10-CM | POA: Diagnosis present

## 2020-08-11 NOTE — Discharge Instructions (Signed)
You were seen in the emergency department for an enlarging skin growth on your right cheek.  This is likely benign but we are going to refer you onto dermatology so they can evaluate and see if it needs to be excised.

## 2020-08-11 NOTE — ED Triage Notes (Addendum)
Pt has area to right face which appears to be skin tag vs mole x 2 weeks.  Not painful, not itching, no fever.  Pt states it is progressively getting bigger.

## 2020-08-11 NOTE — ED Provider Notes (Signed)
[place] EMERGENCY DEPARTMENT Provider Note   CSN: [id number] Arrival date & time: 08/11/20  0759     History Chief Complaint  Patient presents with  . Skin Problem    Daniel Burke is a 50 y.o. male.  He is complaining of a skin growth on his right cheek that is been there for few weeks.  He said it is growing in size.  He does not recall any skin trauma but it is in his shaving area.  Not painful.  No systemic symptoms.  The history is provided by the patient.  Rash Location:  Face Facial rash location:  R cheek Quality: not blistering, not draining, not itchy and not painful   Quality comment:  Growth Severity:  Mild Onset quality:  Gradual Duration:  2 weeks Timing:  Constant Progression:  Worsening Chronicity:  New Relieved by:  None tried Worsened by:  Nothing Ineffective treatments:  None tried Associated symptoms: no fever and no headaches        Past Medical History:  Diagnosis Date  . High cholesterol   . Hypertension   . Kidney stone     There are no problems to display for this patient.   No past surgical history on file.     No family history on file.  Social History   Tobacco Use  . Smoking status: Never Smoker  . Smokeless tobacco: Never Used  Substance Use Topics  . Alcohol use: Yes    Home Medications Prior to Admission medications   Medication Sig Start Date End Date Taking? Authorizing Provider  amLODipine-benazepril (LOTREL) 10-40 MG per capsule Take 1 capsule by mouth daily.    [provider]  cetirizine (ZYRTEC) 10 MG tablet Take 10 mg by mouth 3 times/day as needed-between meals & bedtime for allergies.     [provider]  fluticasone (FLONASE) 50 MCG/ACT nasal spray Place 2 sprays into the nose daily.    [provider]  oxyCODONE-acetaminophen (ROXICET) 5-325 MG per tablet Take 1 tablet by mouth every 4 (four) hours as needed for severe pain. 12/07/14   Hedges, Dellis Filbert, PA-C   predniSONE (DELTASONE) 20 MG tablet 3 tabs po day one, then 2 tabs daily x 4 days Patient not taking: Reported on 05/13/2014 07/12/12   Nat Christen, MD  tamsulosin (FLOMAX) 0.4 MG CAPS capsule Take 1 capsule (0.4 mg total) by mouth daily. 12/07/14   Hedges, Dellis Filbert, PA-C    Allergies    Ibuprofen  Review of Systems   Review of Systems  Constitutional: Negative for fever.  Skin: Positive for rash.  Neurological: Negative for headaches.    Physical Exam Updated Vital Signs BP (!) 140/94   Pulse 76   Temp 97.8 F (36.6 C) (Oral)   Resp 16   Ht 5\' 7"  (1.702 m)   Wt 74.8 kg   SpO2 100%   BMI 25.84 kg/m   Physical Exam Vitals and nursing note reviewed.  Constitutional:      Appearance: He is well-developed.  HENT:     Head: Normocephalic and atraumatic.     Comments: On his right cheek there is a pedunculated mass approximately 3 mm x 5 mm.  There is no induration.  No ulceration.    Mouth/Throat:     Mouth: Mucous membranes are moist.     Pharynx: Oropharynx is clear.  Eyes:     Conjunctiva/sclera: Conjunctivae normal.  Pulmonary:     Effort: Pulmonary effort is normal.  Musculoskeletal:  Cervical back: Neck supple.  Skin:    General: Skin is warm and dry.  Neurological:     General: No focal deficit present.     Mental Status: He is alert.     GCS: GCS eye subscore is 4. GCS verbal subscore is 5. GCS motor subscore is 6.     ED Results / Procedures / Treatments   Labs (all labs ordered are listed, but only abnormal results are displayed) Labs Reviewed - No data to display  EKG None  Radiology No results found.  Procedures Procedures   Medications Ordered in ED Medications - No data to display  ED Course  I have reviewed the triage vital signs and the nursing notes.  Pertinent labs & imaging results that were available during my care of the patient were reviewed by me and considered in my medical decision making (see chart for details).    MDM  Rules/Calculators/A&P                          Differential includes skin tag, pyogenic granuloma, less likely malignancy.  Will refer to dermatology. Final Clinical Impression(s) / ED Diagnoses Final diagnoses:  Skin growth    Rx / DC Orders ED Discharge Orders         Ordered    Ambulatory referral to Dermatology       Comments: Skin growth right cheek   08/11/20 0840           Hayden Rasmussen, MD 08/11/20 2016
